# Patient Record
Sex: Female | Born: 1950 | Race: White | Hispanic: No | Marital: Married
Health system: Southern US, Community
[De-identification: ages and names within clinical notes are randomized; demographics above are authoritative.]

---

## 1998-02-08 ENCOUNTER — Other Ambulatory Visit: Admission: RE | Admit: 1998-02-08 | Discharge: 1998-02-08 | Payer: Self-pay | Admitting: *Deleted

## 2000-04-28 ENCOUNTER — Other Ambulatory Visit: Admission: RE | Admit: 2000-04-28 | Discharge: 2000-04-28 | Payer: Self-pay | Admitting: *Deleted

## 2001-10-01 ENCOUNTER — Other Ambulatory Visit: Admission: RE | Admit: 2001-10-01 | Discharge: 2001-10-01 | Payer: Self-pay | Admitting: *Deleted

## 2001-10-14 ENCOUNTER — Other Ambulatory Visit: Admission: RE | Admit: 2001-10-14 | Discharge: 2001-10-14 | Payer: Self-pay | Admitting: Radiology

## 2001-10-26 ENCOUNTER — Encounter: Payer: Self-pay | Admitting: General Surgery

## 2001-10-26 ENCOUNTER — Encounter: Admission: RE | Admit: 2001-10-26 | Discharge: 2001-10-26 | Payer: Self-pay | Admitting: General Surgery

## 2001-10-28 ENCOUNTER — Ambulatory Visit (HOSPITAL_BASED_OUTPATIENT_CLINIC_OR_DEPARTMENT_OTHER): Admission: RE | Admit: 2001-10-28 | Discharge: 2001-10-28 | Payer: Self-pay | Admitting: General Surgery

## 2001-10-28 ENCOUNTER — Encounter (INDEPENDENT_AMBULATORY_CARE_PROVIDER_SITE_OTHER): Payer: Self-pay | Admitting: *Deleted

## 2001-10-28 ENCOUNTER — Encounter: Payer: Self-pay | Admitting: General Surgery

## 2001-11-27 ENCOUNTER — Encounter: Payer: Self-pay | Admitting: *Deleted

## 2001-11-27 ENCOUNTER — Ambulatory Visit (HOSPITAL_COMMUNITY): Admission: RE | Admit: 2001-11-27 | Discharge: 2001-11-27 | Payer: Self-pay | Admitting: *Deleted

## 2001-12-08 ENCOUNTER — Encounter: Payer: Self-pay | Admitting: General Surgery

## 2001-12-08 ENCOUNTER — Ambulatory Visit (HOSPITAL_BASED_OUTPATIENT_CLINIC_OR_DEPARTMENT_OTHER): Admission: RE | Admit: 2001-12-08 | Discharge: 2001-12-08 | Payer: Self-pay | Admitting: General Surgery

## 2001-12-31 ENCOUNTER — Ambulatory Visit: Admission: RE | Admit: 2001-12-31 | Discharge: 2001-12-31 | Payer: Self-pay | Admitting: Oncology

## 2002-03-25 ENCOUNTER — Ambulatory Visit: Admission: RE | Admit: 2002-03-25 | Discharge: 2002-06-08 | Payer: Self-pay | Admitting: Radiation Oncology

## 2002-04-06 ENCOUNTER — Ambulatory Visit (HOSPITAL_BASED_OUTPATIENT_CLINIC_OR_DEPARTMENT_OTHER): Admission: RE | Admit: 2002-04-06 | Discharge: 2002-04-06 | Payer: Self-pay | Admitting: General Surgery

## 2002-07-01 ENCOUNTER — Ambulatory Visit: Admission: RE | Admit: 2002-07-01 | Discharge: 2002-07-01 | Payer: Self-pay | Admitting: Radiation Oncology

## 2002-08-27 ENCOUNTER — Encounter: Payer: Self-pay | Admitting: *Deleted

## 2002-08-27 ENCOUNTER — Ambulatory Visit (HOSPITAL_COMMUNITY): Admission: RE | Admit: 2002-08-27 | Discharge: 2002-08-27 | Payer: Self-pay | Admitting: *Deleted

## 2002-10-25 ENCOUNTER — Ambulatory Visit (HOSPITAL_COMMUNITY): Admission: RE | Admit: 2002-10-25 | Discharge: 2002-10-25 | Payer: Self-pay | Admitting: *Deleted

## 2002-10-25 ENCOUNTER — Encounter: Payer: Self-pay | Admitting: *Deleted

## 2002-11-09 ENCOUNTER — Other Ambulatory Visit: Admission: RE | Admit: 2002-11-09 | Discharge: 2002-11-09 | Payer: Self-pay | Admitting: *Deleted

## 2003-04-07 ENCOUNTER — Ambulatory Visit: Admission: RE | Admit: 2003-04-07 | Discharge: 2003-04-07 | Payer: Self-pay | Admitting: Radiation Oncology

## 2003-05-12 ENCOUNTER — Ambulatory Visit: Admission: RE | Admit: 2003-05-12 | Discharge: 2003-05-12 | Payer: Self-pay | Admitting: Radiation Oncology

## 2004-05-16 ENCOUNTER — Ambulatory Visit: Payer: Self-pay | Admitting: Oncology

## 2004-11-21 ENCOUNTER — Ambulatory Visit: Payer: Self-pay | Admitting: Oncology

## 2005-02-13 ENCOUNTER — Encounter: Admission: RE | Admit: 2005-02-13 | Discharge: 2005-02-13 | Payer: Self-pay | Admitting: *Deleted

## 2005-05-24 ENCOUNTER — Ambulatory Visit: Payer: Self-pay | Admitting: Oncology

## 2005-11-25 ENCOUNTER — Ambulatory Visit: Payer: Self-pay | Admitting: Oncology

## 2006-12-01 ENCOUNTER — Ambulatory Visit (HOSPITAL_COMMUNITY): Admission: RE | Admit: 2006-12-01 | Discharge: 2006-12-01 | Payer: Self-pay | Admitting: Urology

## 2008-01-09 IMAGING — CR DG ABDOMEN 1V
1 series · 1 of 1 positions shown · non-contrast
Comparison: none

CLINICAL DATA: Patient has a left renal stone. 
 ABDOMEN ? 1 VIEW:

[t abdomen supine]
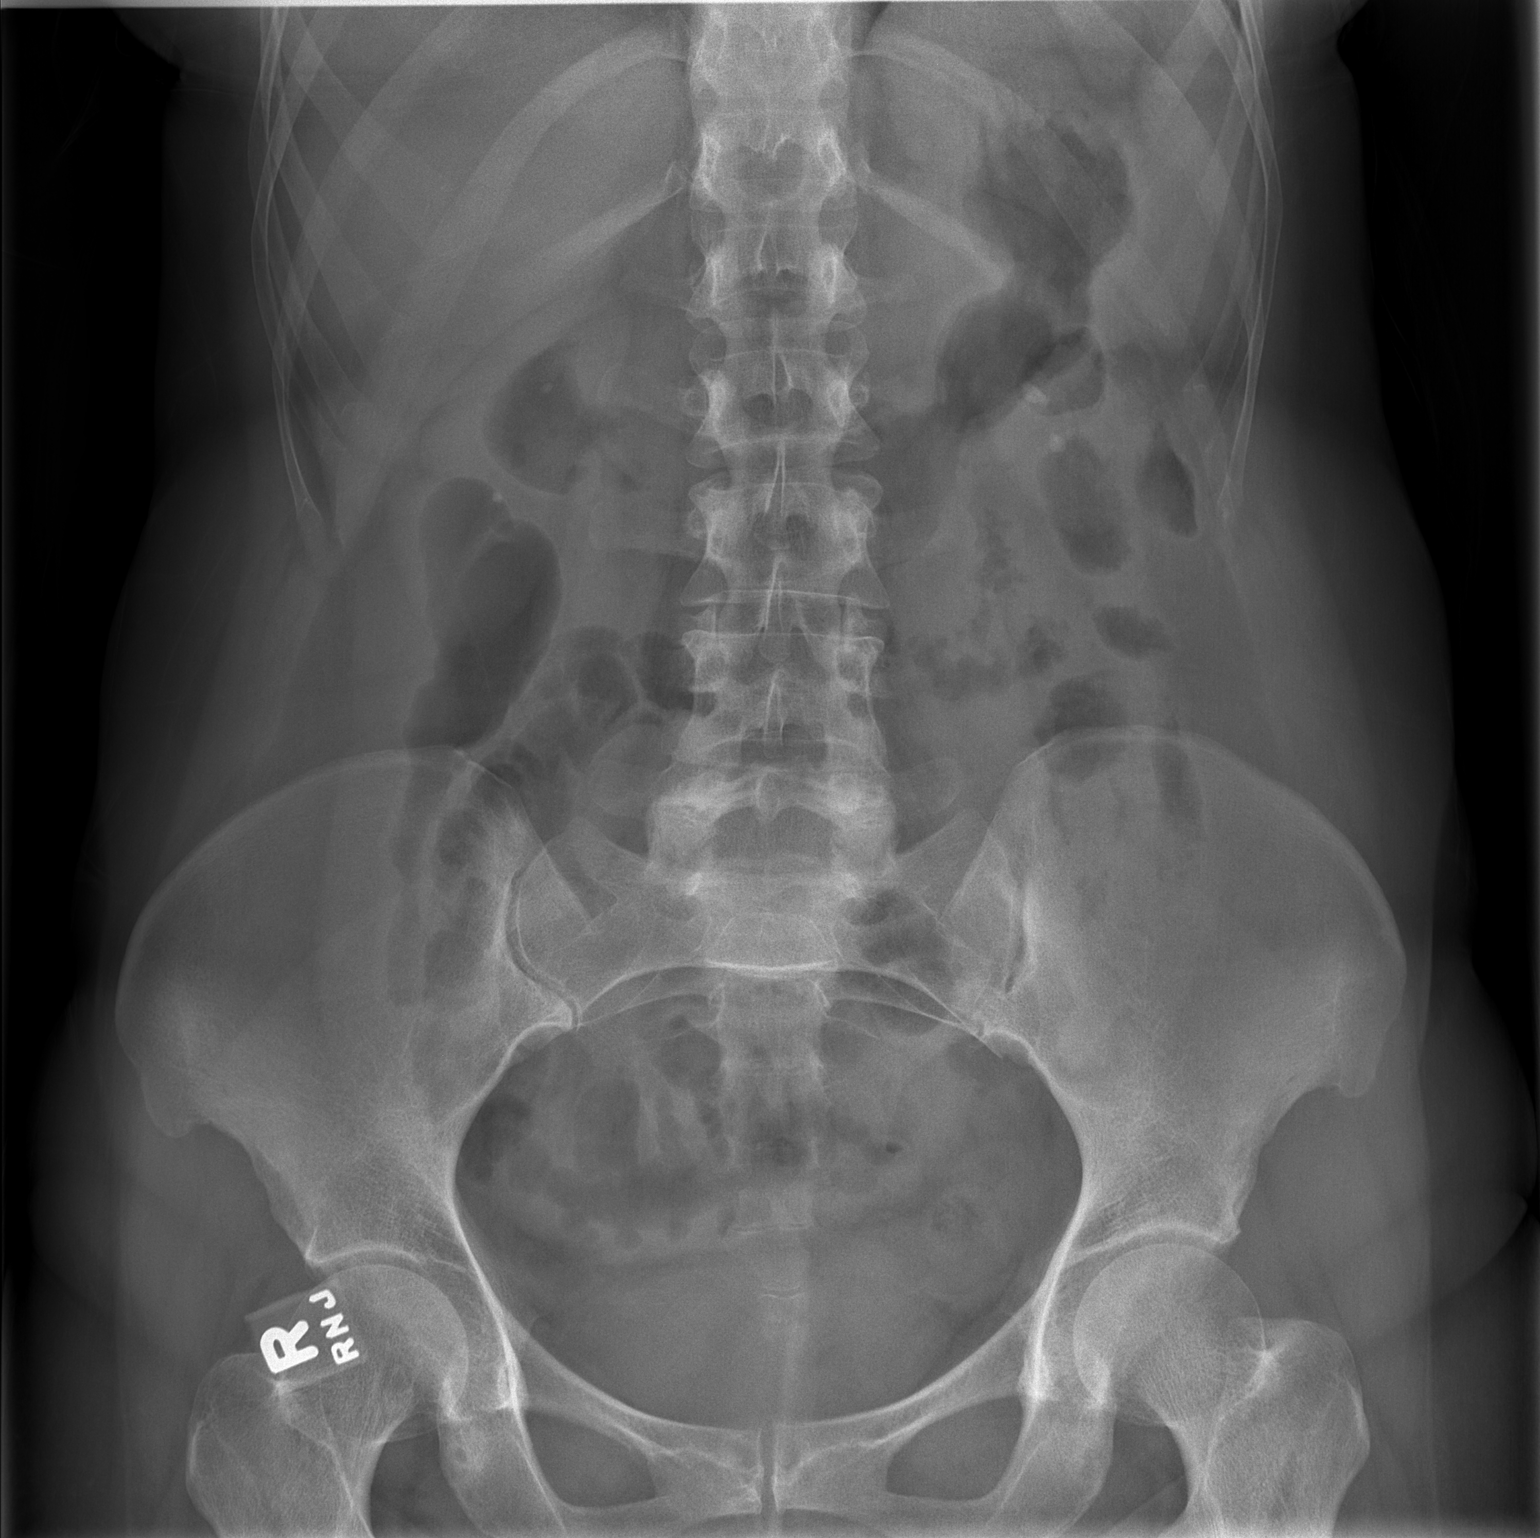

[1 of 1 positions shown; findings below may reference images not displayed]

FINDINGS: AP view of the abdomen reveals bilateral renal calculi with the largest calculus being on the left measuring up to 7.8 mm.  There is mild ileus of the small bowel and colon.
IMPRESSION: Bilateral renal calculi.

## 2009-04-22 ENCOUNTER — Ambulatory Visit: Payer: Self-pay | Admitting: Diagnostic Radiology

## 2009-04-22 ENCOUNTER — Emergency Department (HOSPITAL_BASED_OUTPATIENT_CLINIC_OR_DEPARTMENT_OTHER): Admission: EM | Admit: 2009-04-22 | Discharge: 2009-04-23 | Payer: Self-pay | Admitting: Emergency Medicine

## 2010-05-31 IMAGING — CT CT HEAD W/O CM
1 series · 16 of 30 positions shown, 20 images · non-contrast
Comparison: None.

CLINICAL DATA: Fell backwards, striking the back of head.
Laceration.  No loss of consciousness.

CT HEAD WITHOUT CONTRAST 04/22/2009:
TECHNIQUE: Contiguous axial images were obtained from the base of
the skull through the vertex without contrast.

[Series 2: head 4.8 h37s · axial · 0.43mm/px · z∈[-174,-22]mm · 16 of 36 slices shown, 20 images]
[im 2/36  brain]
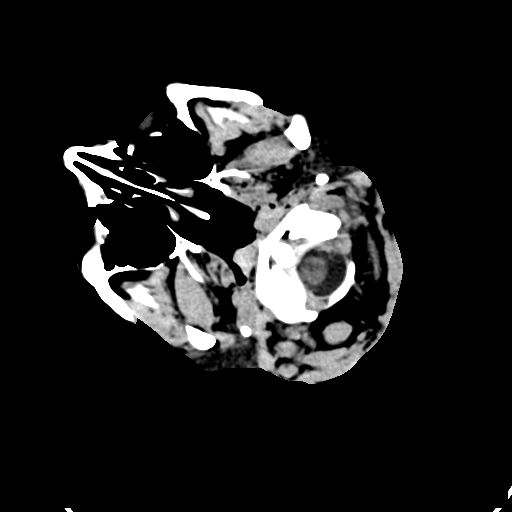
[im 2/36  bone]
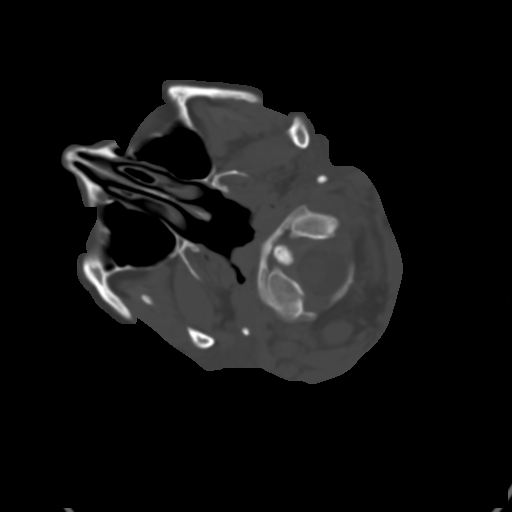
[im 4/36  brain]
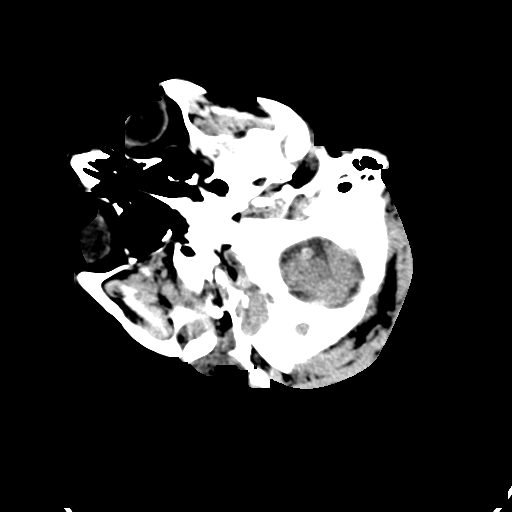
[im 7/36  brain]
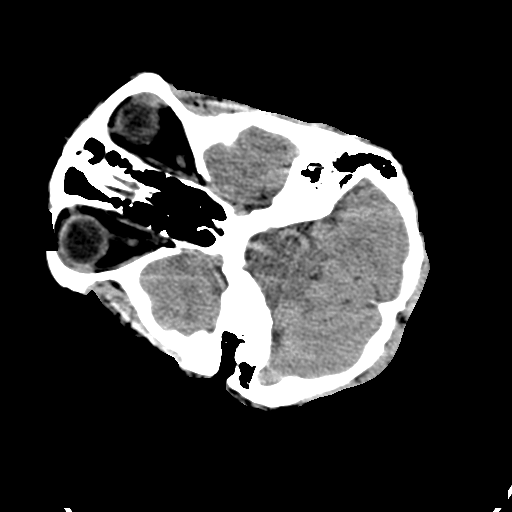
[im 9/36  brain]
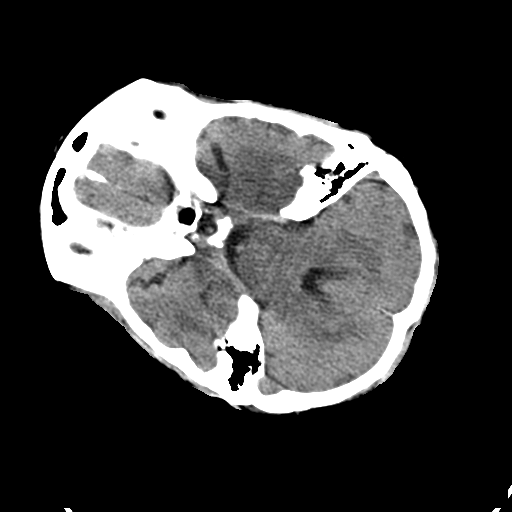
[im 10/36  brain]
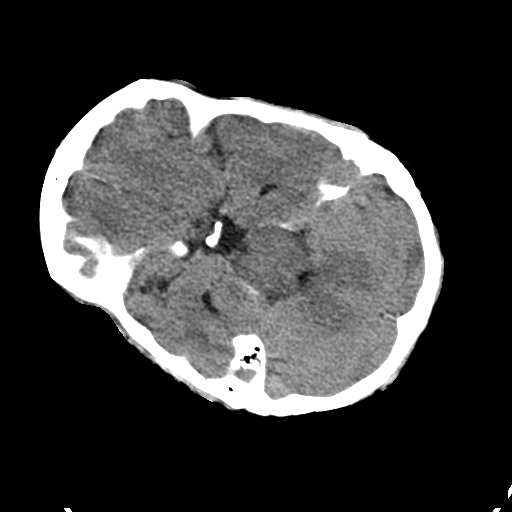
[im 10/36  bone]
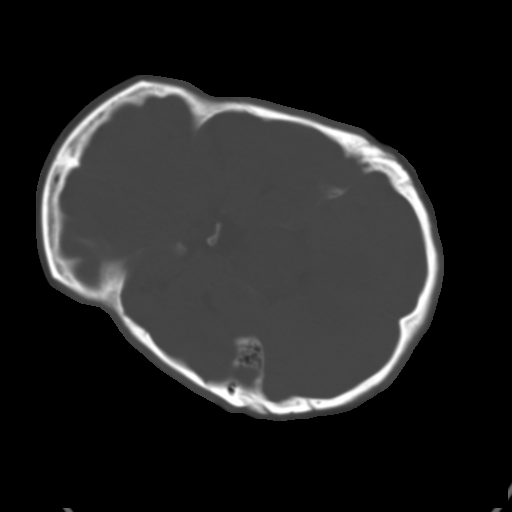
[im 13/36  brain]
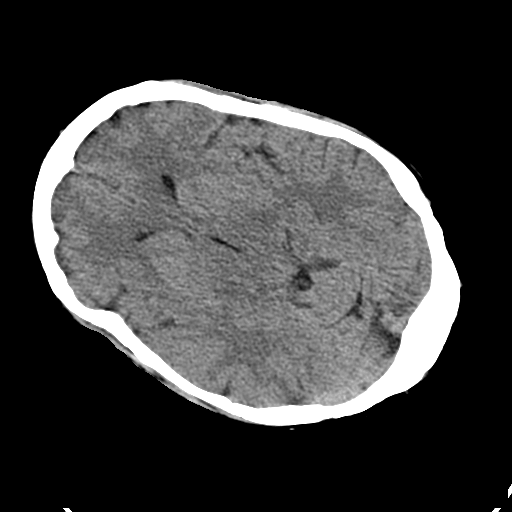
[im 15/36  brain]
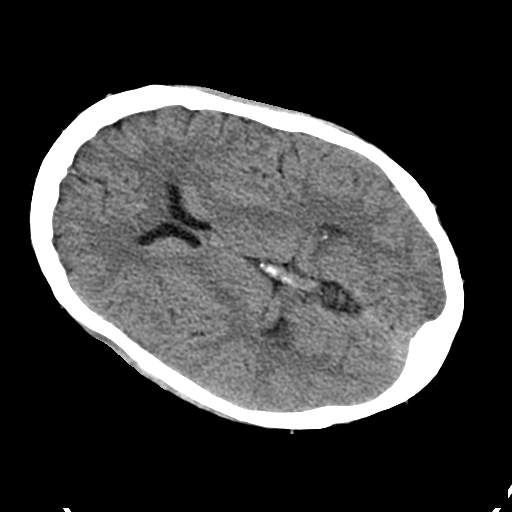
[im 17/36  brain]
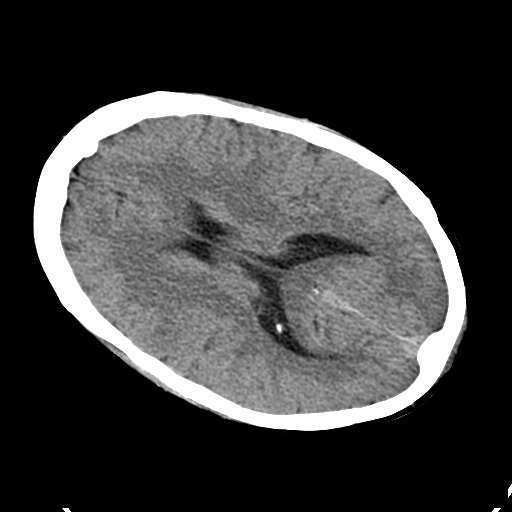
[im 19/36  brain]
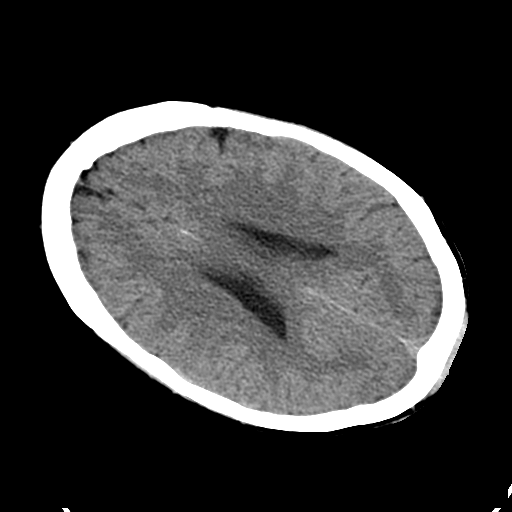
[im 19/36  bone]
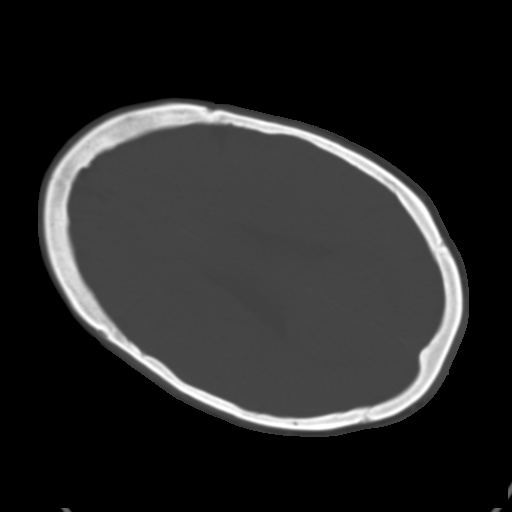
[im 21/36  brain]
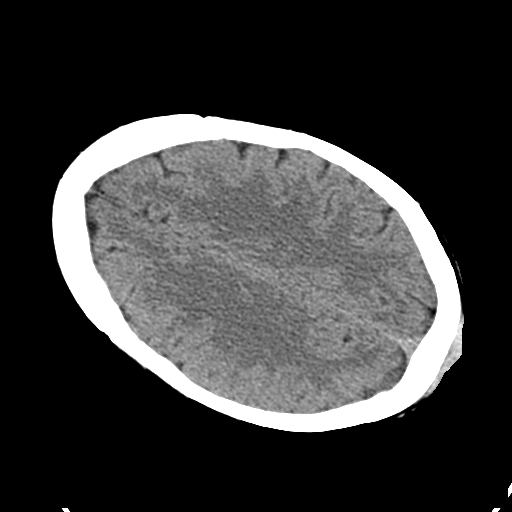
[im 23/36  brain]
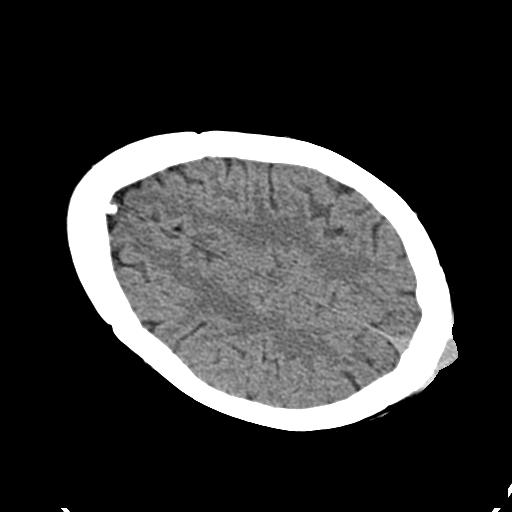
[im 26/36  brain]
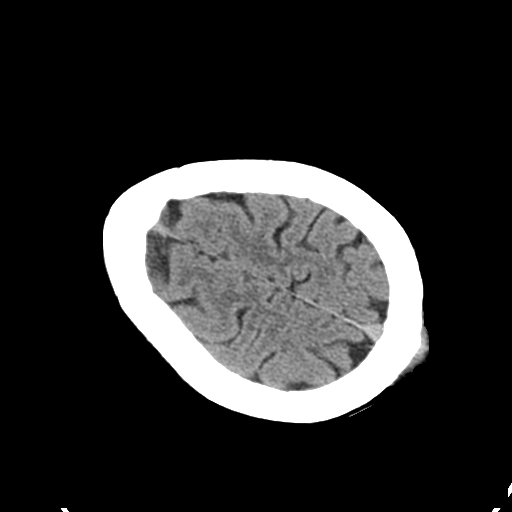
[im 27/36  brain]
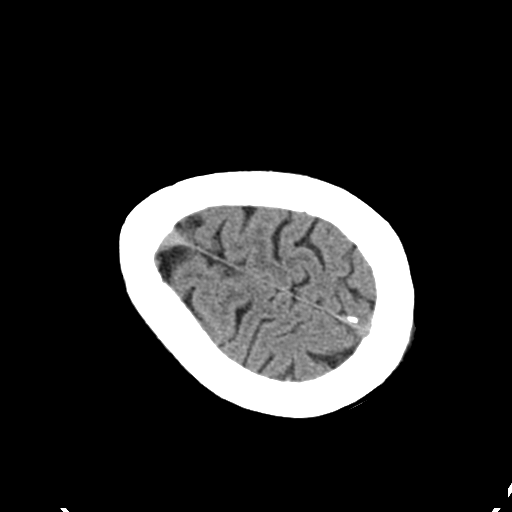
[im 27/36  bone]
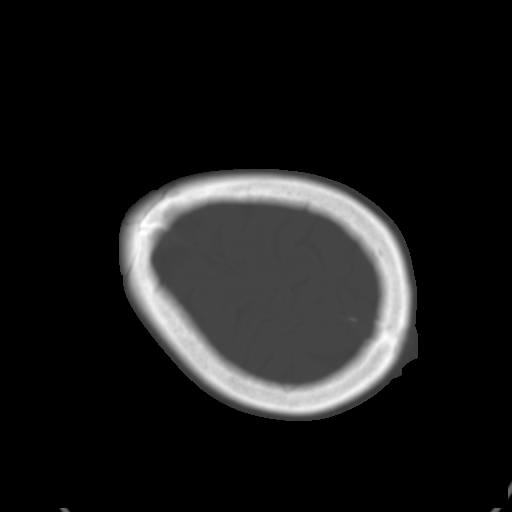
[im 29/36  brain]
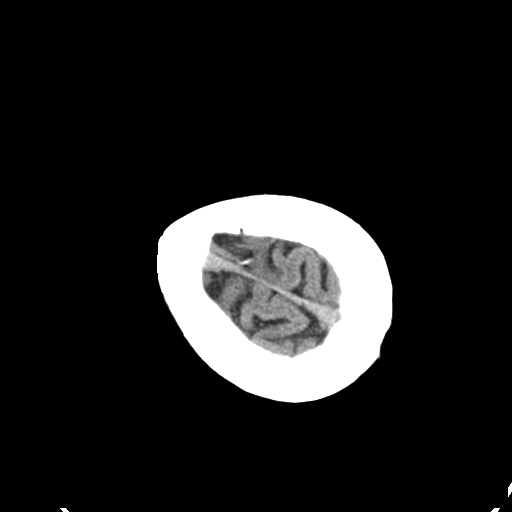
[im 32/36  brain]
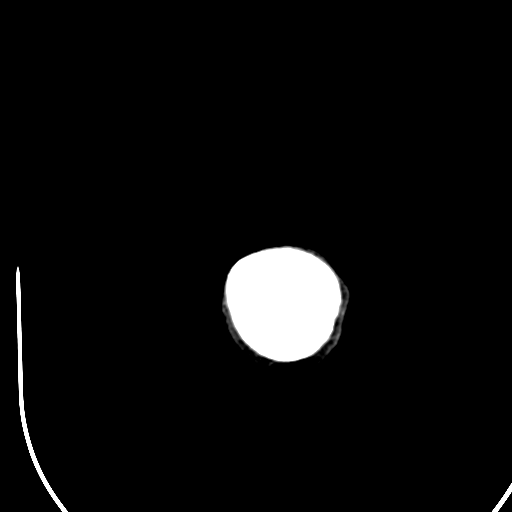
[im 34/36  brain]
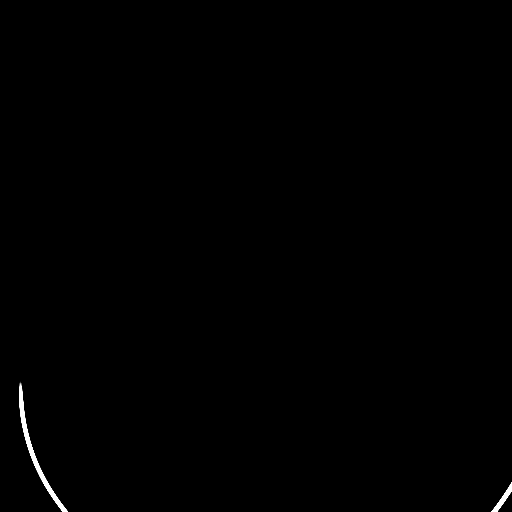

[16 of 30 positions shown; findings below may reference images not displayed]

FINDINGS: Ventricular system normal in size appearance for age.  No
mass lesion.  No midline shift.  No acute hemorrhage or hematoma.
No extra-axial fluid collections.  No evidence of acute infarction.
No focal brain parenchymal abnormalities.

Large occipital scalp hematoma without underlying skull fracture.
Visualized paranasal sinuses, mastoid air cells, and middle ear
cavities well-aerated.
IMPRESSION: 1.  Normal intracranially.
2.  Large occipital scalp hematoma without underlying skull
fracture.

## 2010-10-26 NOTE — Op Note (Signed)
[place]. [facility]  Patient:    Brenda Shea, Brenda Shea Visit Number: [id number] MRN: [id number]          Service Type: DSU Location: [facility] Attending Physician:  Delsa Bern Dictated by:   Lorne Skeens. Hoxworth, M.D. Proc. Date: 10/28/01 Admit Date:  10/28/2001 Discharge Date: 10/28/2001                             Operative Report  PREOPERATIVE DIAGNOSIS:  Cancer of left breast.  POSTOPERATIVE DIAGNOSIS:  Cancer of left breast.  OPERATION PERFORMED: 1. Blue dye injection, left breast. 2. Left axillary sentinel lymph node biopsy. 3. Left partial mastectomy.  SURGEON:  Lorne Skeens. Hoxworth, M.D.  ANESTHESIA:  Laryngeal mask general.  INDICATIONS FOR PROCEDURE:  The patient is a 60 year old white female who on a recent mammogram was found to have an area of microcalcifications and architectural distortion in the lower outer left breast.  Ultrasound confirmed a 15 mm mass.  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  After discussion of initial surgical treatment options we have elected to proceed with left axillary sentinel lymph node biopsy with possible axillary dissection and left partial mastectomy.  The nature of the procedure, its indications, risks of bleeding and infection and possible need for further surgery were discussed and understood.  She was now brought to the operating room for these procedures.  DESCRIPTION OF PROCEDURE:  Following injection of technetium sulfur colloid circumareolarly two hours prior to the procedure, the patient was brought to the operating room and placed in supine position on the operating table and laryngeal mask general anesthesia was induced.  The left breast, chest and arm were sterilely prepped and draped.  10 cc of Lymphazurin blue was injected intradermally, circumareolarly and massaged for two minutes.  The Neoprobe was used to localize a hot area in the left axilla  and a small transverse incision made and dissection carried down through the subcutaneous tissue and into the axilla proper with cautery.  Using blunt dissection a blue lymphatic was found and traced down to a node which was hot with the Neoprobe.  This was completely excised using cautery and clips on the pedicle.  Ex-vivo the node had counts of approximately 200 and there was a small area of blue on the node.  Most of the axilla had counts around 10 to 15 but there was one area that still appeared to be hot and dissection posteriorly revealed a second blue node which was excised in identical fashion and had ex-vivo counts of around 500.  Following this, no counts could be obtained in the axilla above 10.  Attention was then turned to the lumpectomy.  A curvilinear incision was made directly over the palpable mass in the lower outer quadrant excising a small area of skin incorporating the core biopsy tract.  Dissection was carried down into subcutaneous tissues and then the mass was grossly completely excised with a fairly wide margin of normal tissue in all directions.  The mass lay fairly deep with intact fascia removed with the specimen but a further small posterior margin was obtained for permanent sections.  Both wounds were infiltrated with Marcaine.  Both sentinel nodes returned as negative by Touch Prep.  Hemostasis obtained in both wounds and in each case the subcutaneous tissues was reapproximated with interrupted 4-0 Monocryl and the skin with running subcuticular 4-0 Monocryl and Steri-Strips. Sponge, needle and instrument counts  were correct.  Dry sterile dressings were applied.  The patient was transferred to the recovery room in good condition. Dictated by:   Lorne Skeens. Hoxworth, M.D. Attending Physician:  Delsa Bern DD:  10/28/01 TD:  10/30/01 Job: [id number]

## 2010-10-26 NOTE — Op Note (Signed)
[place]. [facility]  Patient:    ISABELLY, KOBLER Visit Number: [id number] MRN: [id number]          Service Type: DSU Location: [facility] Attending Physician:  Delsa Bern Dictated by:   Lorne Skeens. Hoxworth, M.D. Proc. Date: 12/08/01 Admit Date:  12/08/2001 Discharge Date: 12/08/2001                             Operative Report  PREOPERATIVE DIAGNOSIS:  Cancer of the breast and poor venous access.  POSTOPERATIVE DIAGNOSIS:  Cancer of the breast and poor venous access.  OPERATION PERFORMED:  Placement of LifePort via right cephalic vein cutdown.  SURGEON:  Lorne Skeens. Hoxworth, M.D.  ANESTHESIA:  Local with IV sedation.  INDICATIONS FOR PROCEDURE:  The patient is a 60 year old white female with a recent diagnosis of carcinoma who will return long term venous access for chemotherapy.  Placement of a subcutaneous venous port has been recommended and accepted.  The nature of the procedure, its indications and risks of bleeding, infection, catheter displacement, thrombosis and pneumothorax were discussed and understood preoperatively.  She is now brought to the operating room for this procedure.  DESCRIPTION OF PROCEDURE:   The patient was brought to the operating room and placed in supine position on the operating table and IV sedation was administered.  She was given preoperative antibiotics.  The right neck, chest and shoulder were sterilely prepped and draped, local anesthesia was used to infiltrate the infraclavicular area on the right.  I was able to cannulate the right subclavian vein but the guide wire would not thread on two attempts.  I therefore elected to access the right cephalic vein.  An area in the deltopectoral groove was anesthetized and a small incision made and dissection carried down bluntly into the deltopectoral groove and the cephalic vein identified, dissected free over about a centimeter and encircled with two  4-0 silk ties.  The flush catheter was then inserted via the venous cutdown and its tip positioned in the superior vena cava.  A site on the anterior chest wall was chosen for the port, was anesthetized.  A transverse incision was made and a subcutaneous pocket created.  The catheter was tunneled subcutaneously into the pocket, trimmed to length and attached to the port which was sutured to the anterior chest wall with three interrupted 2-0 Prolene sutures.  Following this, the subcutaneous was reapproximated with interrupted 4-0 Monocryl and the skin with running subcuticular 4-0 Monocryl and Steri-Strips.  Sponge and needle counts were correct.  Dry sterile dressing was applied.  The patient was taken to the recovery room in good condition. Dictated by:   Lorne Skeens. Hoxworth, M.D. Attending Physician:  Delsa Bern DD:  12/08/01 TD:  12/10/01 Job: [id number]

## 2019-08-01 ENCOUNTER — Telehealth: Payer: Self-pay | Admitting: *Deleted

## 2019-08-01 NOTE — Telephone Encounter (Signed)
Patient inquiring where she is on the waiting list. I was able to schedule her for this week for her 1st Covid 19 vaccine.

## 2019-08-05 ENCOUNTER — Ambulatory Visit: Payer: Medicare Other

## 2019-08-10 ENCOUNTER — Ambulatory Visit: Payer: Medicare Other

## 2019-08-16 ENCOUNTER — Ambulatory Visit: Payer: Medicare Other | Attending: Internal Medicine

## 2019-08-16 DIAGNOSIS — Z23 Encounter for immunization: Secondary | ICD-10-CM

## 2019-08-16 NOTE — Progress Notes (Signed)
   Covid-19 Vaccination Clinic  Name:  Brenda Shea    MRN: [id number] DOB: 12-08-1950  08/16/2019  Brenda Shea was observed post Covid-19 immunization for 15 minutes without incident. She was provided with Vaccine Information Sheet and instruction to access the V-Safe system.   Brenda Shea was instructed to call 911 with any severe reactions post vaccine: Marland Kitchen Difficulty breathing  . Swelling of face and throat  . A fast heartbeat  . A bad rash all over body  . Dizziness and weakness   Immunizations Administered    Name Date Dose VIS Date Route   Pfizer COVID-19 Vaccine 08/16/2019  3:47 PM 0.3 mL 05/21/2019 Intramuscular   Manufacturer: [employer], [employer]   Lot: [id number]   NDC: [address]-2

## 2019-09-07 ENCOUNTER — Ambulatory Visit: Payer: Medicare Other | Attending: Internal Medicine

## 2019-09-07 ENCOUNTER — Ambulatory Visit: Payer: Medicare Other

## 2019-09-07 DIAGNOSIS — Z23 Encounter for immunization: Secondary | ICD-10-CM

## 2019-09-07 NOTE — Progress Notes (Signed)
   Covid-19 Vaccination Clinic  Name:  Brenda Shea    MRN: [id number] DOB: [birth year]  09/07/2019  [name] was observed post Covid-19 immunization for 15 minutes without incident. She was provided with Vaccine Information Sheet and instruction to access the V-Safe system.   [name] was instructed to call 911 with any severe reactions post vaccine: Marland Kitchen Difficulty breathing  . Swelling of face and throat  . A fast heartbeat  . A bad rash all over body  . Dizziness and weakness   Immunizations Administered    Name Date Dose VIS Date Route   Pfizer COVID-19 Vaccine 09/07/2019  4:40 PM 0.3 mL 05/21/2019 Intramuscular   Manufacturer: [employer], [employer]   Lot: [id number]   NDC: [address]-2

## 2019-09-21 ENCOUNTER — Ambulatory Visit: Payer: Medicare Other

## 2020-07-12 ENCOUNTER — Other Ambulatory Visit: Payer: Self-pay | Admitting: Obstetrics and Gynecology

## 2020-07-12 DIAGNOSIS — Z780 Asymptomatic menopausal state: Secondary | ICD-10-CM

## 2020-08-15 LAB — COLOGUARD: COLOGUARD: NEGATIVE

## 2020-12-07 ENCOUNTER — Other Ambulatory Visit: Payer: Medicare Other
